# Patient Record
Sex: Male | Born: 2000 | Race: Asian | Hispanic: No | Marital: Single
Health system: Southern US, Community
[De-identification: ages and names within clinical notes are randomized; demographics above are authoritative.]

## PROBLEM LIST (undated history)

## (undated) DIAGNOSIS — J309 Allergic rhinitis, unspecified: Secondary | ICD-10-CM

## (undated) DIAGNOSIS — J45909 Unspecified asthma, uncomplicated: Secondary | ICD-10-CM

---
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
## 2020-02-26 ENCOUNTER — Other Ambulatory Visit: Payer: Self-pay

## 2020-02-26 DIAGNOSIS — Z20822 Contact with and (suspected) exposure to covid-19: Secondary | ICD-10-CM

## 2020-02-26 NOTE — Progress Notes (Signed)
lab

## 2020-02-27 LAB — NOVEL CORONAVIRUS, NAA: SARS-CoV-2, NAA: NOT DETECTED

## 2020-02-27 LAB — SARS-COV-2, NAA 2 DAY TAT
